# Patient Record
Sex: Male | Born: 1990 | Race: White | Hispanic: No | Marital: Single | Smoking: Never smoker
Health system: Southern US, Community
[De-identification: ages and names within clinical notes are randomized; demographics above are authoritative.]

## PROBLEM LIST (undated history)

## (undated) DIAGNOSIS — R42 Dizziness and giddiness: Secondary | ICD-10-CM

## (undated) HISTORY — DX: Dizziness and giddiness: R42

---
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
## 2012-08-30 ENCOUNTER — Encounter (HOSPITAL_COMMUNITY): Payer: Self-pay | Admitting: Emergency Medicine

## 2012-08-30 ENCOUNTER — Emergency Department (HOSPITAL_COMMUNITY): Payer: BC Managed Care – PPO

## 2012-08-30 ENCOUNTER — Emergency Department (HOSPITAL_COMMUNITY)
Admission: EM | Admit: 2012-08-30 | Discharge: 2012-08-30 | Disposition: A | Discharge status: Home / Self Care | Payer: BC Managed Care – PPO | Attending: Emergency Medicine | Admitting: Emergency Medicine

## 2012-08-30 DIAGNOSIS — M545 Low back pain, unspecified: Secondary | ICD-10-CM | POA: Insufficient documentation

## 2012-08-30 DIAGNOSIS — M549 Dorsalgia, unspecified: Secondary | ICD-10-CM

## 2012-08-30 DIAGNOSIS — R109 Unspecified abdominal pain: Secondary | ICD-10-CM | POA: Insufficient documentation

## 2012-08-30 LAB — COMPREHENSIVE METABOLIC PANEL
ALT: 15 U/L (ref 0–53)
AST: 15 U/L (ref 0–37)
Albumin: 4.3 g/dL (ref 3.5–5.2)
Alkaline Phosphatase: 41 U/L (ref 39–117)
BUN: 18 mg/dL (ref 6–23)
CO2: 29 mEq/L (ref 19–32)
Calcium: 9.7 mg/dL (ref 8.4–10.5)
Chloride: 102 mEq/L (ref 96–112)
Creatinine, Ser: 0.87 mg/dL (ref 0.50–1.35)
GFR calc Af Amer: >90 mL/min (ref >90)
GFR calc non Af Amer: >90 mL/min (ref >90)
Glucose, Bld: 99 mg/dL (ref 70–99)
Potassium: 3.9 mEq/L (ref 3.5–5.1)
Sodium: 139 mEq/L (ref 135–145)
Total Bilirubin: 1 mg/dL (ref 0.3–1.2)
Total Protein: 7 g/dL (ref 6.0–8.3)

## 2012-08-30 LAB — URINALYSIS, ROUTINE W REFLEX MICROSCOPIC
Bilirubin Urine: NEGATIVE
Glucose, UA: NEGATIVE mg/dL
Hgb urine dipstick: NEGATIVE
Ketones, ur: NEGATIVE mg/dL
Leukocytes, UA: NEGATIVE
Nitrite: NEGATIVE
Protein, ur: NEGATIVE mg/dL
Specific Gravity, Urine: 1.037 — ABNORMAL HIGH (ref 1.005–1.030)
Urobilinogen, UA: 0.2 mg/dL (ref 0.0–1.0)
pH: 5.5 (ref 5.0–8.0)

## 2012-08-30 LAB — CBC
HCT: 39.8 % (ref 39.0–52.0)
Hemoglobin: 14.5 g/dL (ref 13.0–17.0)
MCH: 31.4 pg (ref 26.0–34.0)
MCHC: 36.4 g/dL — ABNORMAL HIGH (ref 30.0–36.0)
MCV: 86.1 fL (ref 78.0–100.0)
Platelets: 206 10*3/uL (ref 150–400)
RBC: 4.62 MIL/uL (ref 4.22–5.81)
RDW: 12.2 % (ref 11.5–15.5)
WBC: 5 10*3/uL (ref 4.0–10.5)

## 2012-08-30 MED ORDER — SODIUM CHLORIDE 0.9 % IV SOLN: INTRAVENOUS | Status: DC

## 2012-08-30 MED ORDER — ONDANSETRON HCL 4 MG/2ML IJ SOLN: 4.0000 mg | Freq: Once | INTRAMUSCULAR | Status: DC

## 2012-08-30 MED ORDER — FENTANYL CITRATE 0.05 MG/ML IJ SOLN: 50.0000 ug | INTRAMUSCULAR | Status: DC | PRN

## 2012-08-30 NOTE — ED Notes (Signed)
"  After I ate around 9pm last night my lower stomach started to hurt real bad and the lower part of my back"  I felt nauseated but I didn't throw up"

## 2012-08-30 NOTE — ED Provider Notes (Signed)
Care assumed t the change of shift. Pt seen for abdominal cramping during the night. Labs unremarkable, was ready for discharge when pain returned. Sent for [place] which is negative. Pain resolved now, feeling well and ready to go home.   Charles B. Bernette Mayers, MD 08/30/12 [phone]

## 2012-08-30 NOTE — ED Provider Notes (Addendum)
History     CSN: [id number]  Arrival date & time 08/30/12  4098   First MD Initiated Contact with Patient 08/30/12 0448      Chief Complaint  Patient presents with  . Abdominal Cramping    (Consider location/radiation/quality/duration/timing/severity/associated sxs/prior treatment) HPI History provided by patient. Not feeling well around 9 PM last night after dinner. He had some lower abdominal discomfort. Symptoms progressively worsening to severe pain in lower back and lower abdomen with associated nausea.  No vomiting or diarrhea. No constipation. Last bowel movement last night report is normal. No fevers or chills. No recent illness or known sick contacts. No recent travel. No known bad food exposures. Now the patient is in emergency department symptoms have completely resolved. He had taken some ibuprofen earlier which she believes may have helped. He denies any gas or cramping. No unilateral symptoms. No hematuria or history of kidney stones.    No past medical history on file.  No past surgical history on file.  No family history on file.  History  Substance Use Topics  . Smoking status: Not on file  . Smokeless tobacco: Not on file  . Alcohol Use: No      Review of Systems  Constitutional: Negative for fever and chills.  HENT: Negative for neck pain and neck stiffness.   Eyes: Negative for pain.  Respiratory: Negative for shortness of breath.   Cardiovascular: Negative for chest pain.  Gastrointestinal: Positive for abdominal pain. Negative for blood in stool.  Genitourinary: Negative for dysuria.  Musculoskeletal: Negative for back pain.  Skin: Negative for rash.  Neurological: Negative for headaches.  All other systems reviewed and are negative.    Allergies  Review of patient's allergies indicates no known allergies.  Home Medications   Current Outpatient Rx  Name  Route  Sig  Dispense  Refill  . ibuprofen (ADVIL,MOTRIN) 200 MG tablet   Oral   Take  200 mg by mouth every 6 (six) hours as needed for pain.           BP 141/84  Pulse 70  Temp(Src) 97.7 F (36.5 C) (Oral)  Ht 5\' 9"  (1.753 m)  Wt 134 lb (60.782 kg)  BMI 19.78 kg/m2  SpO2 99%  Physical Exam  Constitutional: He is oriented to person, place, and time. He appears well-developed and well-nourished.  HENT:  Head: Normocephalic and atraumatic.  Eyes: EOM are normal. Pupils are equal, round, and reactive to light.  Neck: Neck supple.  Cardiovascular: Normal rate, regular rhythm and intact distal pulses.   Pulmonary/Chest: Effort normal and breath sounds normal. No respiratory distress.  Abdominal: Soft. Bowel sounds are normal. He exhibits no distension and no mass. There is no tenderness. There is no rebound and no guarding.  No CVA tenderness  Musculoskeletal: Normal range of motion. He exhibits no edema.  Neurological: He is alert and oriented to person, place, and time.  Skin: Skin is warm and dry.    ED Course  Procedures (including critical care time)  Results for orders placed during the hospital encounter of 08/30/12  CBC      Result Value Range   WBC 5.0  4.0 - 10.5 K/uL   RBC 4.62  4.22 - 5.81 MIL/uL   Hemoglobin 14.5  13.0 - 17.0 g/dL   HCT 11.9  14.7 - 82.9 %   MCV 86.1  78.0 - 100.0 fL   MCH 31.4  26.0 - 34.0 pg   MCHC 36.4 (*) 30.0 -  36.0 g/dL   RDW 16.1  09.6 - 04.5 %   Platelets 206  150 - 400 K/uL  COMPREHENSIVE METABOLIC PANEL      Result Value Range   Sodium 139  135 - 145 mEq/L   Potassium 3.9  3.5 - 5.1 mEq/L   Chloride 102  96 - 112 mEq/L   CO2 29  19 - 32 mEq/L   Glucose, Bld 99  70 - 99 mg/dL   BUN 18  6 - 23 mg/dL   Creatinine, Ser 4.09  0.50 - 1.35 mg/dL   Calcium 9.7  8.4 - 81.1 mg/dL   Total Protein 7.0  6.0 - 8.3 g/dL   Albumin 4.3  3.5 - 5.2 g/dL   AST 15  0 - 37 U/L   ALT 15  0 - 53 U/L   Alkaline Phosphatase 41  39 - 117 U/L   Total Bilirubin 1.0  0.3 - 1.2 mg/dL   GFR calc non Af Amer >90  >90 mL/min   GFR calc Af  Amer >90  >90 mL/min  URINALYSIS, ROUTINE W REFLEX MICROSCOPIC      Result Value Range   Color, Urine YELLOW  YELLOW   APPearance CLOUDY (*) CLEAR   Specific Gravity, Urine 1.037 (*) 1.005 - 1.030   pH 5.5  5.0 - 8.0   Glucose, UA NEGATIVE  NEGATIVE mg/dL   Hgb urine dipstick NEGATIVE  NEGATIVE   Bilirubin Urine NEGATIVE  NEGATIVE   Ketones, ur NEGATIVE  NEGATIVE mg/dL   Protein, ur NEGATIVE  NEGATIVE mg/dL   Urobilinogen, UA 0.2  0.0 - 1.0 mg/dL   Nitrite NEGATIVE  NEGATIVE   Leukocytes, UA NEGATIVE  NEGATIVE   5:40 AM on recheck remains pain free in the ER. I had a long discussion with patient and his mother bedside - no indication for emergent imaging at this time. On further history he did have some belching after onset of pain but denies any heartburn symptoms otherwise. Plan discharge home, outpatient referral provided, abdominal pain precautions verbalized as understood.   MDM   Abdominal pain resolved prior to arrival - severe lower back and bilateral lower abdominal pain at home. Asymptomatic in the ED. No lateralizing symptoms. No acute abdomen.   Evaluated with urinalysis labs reviewed as above - no leukocytosis or hematuria or abnormalities.  Serial abdominal exams benign  Vital signs and nursing notes reviewed        Sunnie Nielsen, MD 08/30/12 0543     This patient was being discharged she developed return of pain located epigastric and right upper quadrant with tenderness. Abdominal ultrasound ordered.  Sunnie Nielsen, MD 08/30/12 [phone]

## 2012-08-31 LAB — URINE CULTURE
Colony Count: NO GROWTH
Culture: NO GROWTH

## 2021-05-10 ENCOUNTER — Emergency Department (HOSPITAL_COMMUNITY): Payer: BC Managed Care – PPO

## 2021-05-10 ENCOUNTER — Emergency Department (HOSPITAL_COMMUNITY)
Admission: EM | Admit: 2021-05-10 | Discharge: 2021-05-10 | Disposition: A | Discharge status: Home / Self Care | Payer: BC Managed Care – PPO | Attending: Emergency Medicine | Admitting: Emergency Medicine

## 2021-05-10 ENCOUNTER — Other Ambulatory Visit: Payer: Self-pay

## 2021-05-10 ENCOUNTER — Encounter (HOSPITAL_COMMUNITY): Payer: Self-pay | Admitting: Emergency Medicine

## 2021-05-10 DIAGNOSIS — S0102XA Laceration with foreign body of scalp, initial encounter: Secondary | ICD-10-CM | POA: Insufficient documentation

## 2021-05-10 DIAGNOSIS — D696 Thrombocytopenia, unspecified: Secondary | ICD-10-CM | POA: Diagnosis not present

## 2021-05-10 DIAGNOSIS — Z79899 Other long term (current) drug therapy: Secondary | ICD-10-CM | POA: Insufficient documentation

## 2021-05-10 DIAGNOSIS — Z23 Encounter for immunization: Secondary | ICD-10-CM | POA: Insufficient documentation

## 2021-05-10 DIAGNOSIS — S0181XA Laceration without foreign body of other part of head, initial encounter: Secondary | ICD-10-CM | POA: Diagnosis not present

## 2021-05-10 DIAGNOSIS — S0990XA Unspecified injury of head, initial encounter: Secondary | ICD-10-CM | POA: Diagnosis present

## 2021-05-10 LAB — CBC
HCT: 40 % (ref 39.0–52.0)
Hemoglobin: 13.9 g/dL (ref 13.0–17.0)
MCH: 31.3 pg (ref 26.0–34.0)
MCHC: 34.8 g/dL (ref 30.0–36.0)
MCV: 90.1 fL (ref 80.0–100.0)
Platelets: 86 10*3/uL — ABNORMAL LOW (ref 150–400)
RBC: 4.44 MIL/uL (ref 4.22–5.81)
RDW: 11.9 % (ref 11.5–15.5)
WBC: 6.7 10*3/uL (ref 4.0–10.5)
nRBC: 0 % (ref 0.0–0.2)

## 2021-05-10 LAB — URINALYSIS, MICROSCOPIC (REFLEX)
Bacteria, UA: NONE SEEN
WBC, UA: NONE SEEN WBC/hpf (ref 0–5)

## 2021-05-10 LAB — URINALYSIS, ROUTINE W REFLEX MICROSCOPIC
Bilirubin Urine: NEGATIVE
Glucose, UA: NEGATIVE mg/dL
Ketones, ur: NEGATIVE mg/dL
Leukocytes,Ua: NEGATIVE
Nitrite: NEGATIVE
Protein, ur: NEGATIVE mg/dL
Specific Gravity, Urine: >1.03 — ABNORMAL HIGH (ref 1.005–1.030)
pH: 5.5 (ref 5.0–8.0)

## 2021-05-10 LAB — ETHANOL: Alcohol, Ethyl (B): <10 mg/dL (ref <10)

## 2021-05-10 LAB — COMPREHENSIVE METABOLIC PANEL
ALT: 31 U/L (ref 0–44)
AST: 30 U/L (ref 15–41)
Albumin: 3.7 g/dL (ref 3.5–5.0)
Alkaline Phosphatase: 28 U/L — ABNORMAL LOW (ref 38–126)
Anion gap: 7 (ref 5–15)
BUN: 21 mg/dL — ABNORMAL HIGH (ref 6–20)
CO2: 23 mmol/L (ref 22–32)
Calcium: 8.2 mg/dL — ABNORMAL LOW (ref 8.9–10.3)
Chloride: 108 mmol/L (ref 98–111)
Creatinine, Ser: 0.76 mg/dL (ref 0.61–1.24)
GFR, Estimated: >60 mL/min (ref >60)
Glucose, Bld: 104 mg/dL — ABNORMAL HIGH (ref 70–99)
Potassium: 3.1 mmol/L — ABNORMAL LOW (ref 3.5–5.1)
Sodium: 138 mmol/L (ref 135–145)
Total Bilirubin: 1 mg/dL (ref 0.3–1.2)
Total Protein: 5.9 g/dL — ABNORMAL LOW (ref 6.5–8.1)

## 2021-05-10 LAB — LACTIC ACID, PLASMA: Lactic Acid, Venous: 1.6 mmol/L (ref 0.5–1.9)

## 2021-05-10 MED ORDER — TETANUS-DIPHTH-ACELL PERTUSSIS 5-2.5-18.5 LF-MCG/0.5 IM SUSY
0.5000 mL | PREFILLED_SYRINGE | Freq: Once | INTRAMUSCULAR | Status: AC
  Administered 2021-05-10: 0.5 mL via INTRAMUSCULAR
  Filled 2021-05-10: qty 0.5

## 2021-05-10 MED ORDER — LIDOCAINE-EPINEPHRINE (PF) 2 %-1:200000 IJ SOLN
20.0000 mL | Freq: Once | INTRAMUSCULAR | Status: AC
  Administered 2021-05-10: 20 mL via INTRADERMAL
  Filled 2021-05-10: qty 20
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
## 2021-05-10 NOTE — ED Provider Notes (Signed)
..Laceration Repair  Date/Time: 05/10/2021 9:35 AM Performed by: Marita Kansas, PA-C Authorized by: Marita Kansas, PA-C   Consent:    Consent obtained:  Verbal   Consent given by:  Patient   Risks discussed:  Infection, need for additional repair, pain, poor cosmetic result and poor wound healing   Alternatives discussed:  No treatment and delayed treatment Universal protocol:    Procedure explained and questions answered to patient or proxy's satisfaction: yes     Relevant documents present and verified: yes     Imaging studies available: yes     Patient identity confirmed:  Verbally with patient Anesthesia:    Anesthesia method:  Local infiltration   Local anesthetic:  Lidocaine 1% WITH epi Laceration details:    Location:  Scalp   Scalp location:  Frontal   Length (cm):  7 Pre-procedure details:    Preparation:  Patient was prepped and draped in usual sterile fashion and imaging obtained to evaluate for foreign bodies Exploration:    Hemostasis achieved with:  Direct pressure   Imaging outcome: foreign body not noted     Wound extent: no foreign bodies/material noted, no muscle damage noted, no tendon damage noted, no underlying fracture noted and no vascular damage noted   Treatment:    Area cleansed with:  Saline and povidone-iodine   Amount of cleaning:  Extensive   Irrigation solution:  Sterile saline   Irrigation volume:  500cc   Irrigation method:  Pressure wash   Undermining:  None Skin repair:    Repair method:  Sutures   Suture size:  4-0   Suture material:  Prolene   Suture technique:  Simple interrupted   Number of sutures:  9 Approximation:    Approximation:  Close Repair type:    Repair type:  Complex Post-procedure details:    Dressing:  Antibiotic ointment and non-adherent dressing   Procedure completion:  Tolerated well, no immediate complications .Marland KitchenLaceration Repair  Date/Time: 05/10/2021 9:37 AM Performed by: Marita Kansas, PA-C Authorized by: Marita Kansas,  PA-C   Consent:    Consent obtained:  Verbal   Consent given by:  Patient   Risks discussed:  Infection, need for additional repair, pain, poor cosmetic result and poor wound healing   Alternatives discussed:  No treatment and delayed treatment Universal protocol:    Procedure explained and questions answered to patient or proxy's satisfaction: yes     Relevant documents present and verified: yes     Imaging studies available: yes     Patient identity confirmed:  Verbally with patient Anesthesia:    Anesthesia method:  Local infiltration   Local anesthetic:  Lidocaine 1% WITH epi Laceration details:    Location:  Face   Face location:  Forehead   Length (cm):  5 Exploration:    Hemostasis achieved with:  Direct pressure   Wound extent: no muscle damage noted, no nerve damage noted, no tendon damage noted, no underlying fracture noted and no vascular damage noted   Treatment:    Area cleansed with:  Povidone-iodine and saline   Amount of cleaning:  Extensive   Irrigation solution:  Sterile saline   Irrigation volume:  500cc   Irrigation method:  Pressure wash   Undermining:  None Skin repair:    Repair method:  Sutures   Suture size:  5-0   Suture technique:  Running locked   Number of sutures:  8 Approximation:    Approximation:  Close Repair type:    Repair  type:  Simple .Marland KitchenLaceration Repair  Date/Time: 05/10/2021 9:38 AM Performed by: Marita Kansas, PA-C Authorized by: Marita Kansas, PA-C   Consent:    Consent obtained:  Verbal   Consent given by:  Patient   Risks discussed:  Infection, need for additional repair, pain, poor cosmetic result and poor wound healing   Alternatives discussed:  No treatment and delayed treatment Universal protocol:    Procedure explained and questions answered to patient or proxy's satisfaction: yes     Relevant documents present and verified: yes     Imaging studies available: yes     Patient identity confirmed:  Verbally with  patient Anesthesia:    Anesthesia method:  Local infiltration Laceration details:    Location:  Scalp   Scalp location:  Frontal   Length (cm):  4 Pre-procedure details:    Preparation:  Patient was prepped and draped in usual sterile fashion Exploration:    Hemostasis achieved with:  Direct pressure   Wound extent: no foreign bodies/material noted, no muscle damage noted, no nerve damage noted, no tendon damage noted, no underlying fracture noted and no vascular damage noted   Treatment:    Area cleansed with:  Povidone-iodine and saline   Amount of cleaning:  Extensive   Irrigation solution:  Sterile saline   Irrigation volume:  500cc   Irrigation method:  Pressure wash   Undermining:  None Skin repair:    Repair method:  Sutures   Suture size:  4-0   Suture material:  Prolene   Number of sutures:  4 Approximation:    Approximation:  Close Repair type:    Repair type:  Simple    Marita Kansas, PA-C 05/10/21 0943    Terrilee Files, MD 05/10/21 1753

## 2021-05-10 NOTE — ED Notes (Signed)
Pt transported to CT ?

## 2021-05-10 NOTE — Discharge Instructions (Addendum)
Do not let your laceration (cut) get wet for the next 48 hours. After that you may allow soapy water to drain down the wound to clean it. Please do not scrub.  Your stitches will need to be removed in 5 to 7 days. To minimize scarring, you can apply a vaseline based ointment for the next 2 weeks and keep it out of direct sun light. After that, you may apply sunscreen for the next several months. The sutures near your hairline will need to be removed in 7-10 days. The sutures in the forehead will dissolve on their own. Return if your wound appears to be infected (see laceration care instructions).  For pain control you may take at 1000 mg of Tylenol every 8 hours as needed.
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
## 2021-05-12 ENCOUNTER — Ambulatory Visit: Payer: BC Managed Care – PPO | Admitting: Internal Medicine

## 2021-05-12 ENCOUNTER — Ambulatory Visit (INDEPENDENT_AMBULATORY_CARE_PROVIDER_SITE_OTHER): Payer: BC Managed Care – PPO

## 2021-05-12 ENCOUNTER — Encounter: Payer: Self-pay | Admitting: Internal Medicine

## 2021-05-12 ENCOUNTER — Other Ambulatory Visit: Payer: Self-pay

## 2021-05-12 VITALS — BP 120/68 | HR 91 | Ht 69.0 in | Wt 138.0 lb

## 2021-05-12 DIAGNOSIS — R55 Syncope and collapse: Secondary | ICD-10-CM

## 2021-05-12 DIAGNOSIS — R002 Palpitations: Secondary | ICD-10-CM

## 2021-05-12 NOTE — Patient Instructions (Addendum)
Medication Instructions:  Your physician recommends that you continue on your current medications as directed. Please refer to the Current Medication list given to you today.  *If you need a refill on your cardiac medications before your next appointment, please call your pharmacy*   Lab Work: NONE If you have labs (blood work) drawn today and your tests are completely normal, you will receive your results only by: MyChart Message (if you have MyChart) OR A paper copy in the mail If you have any lab test that is abnormal or we need to change your treatment, we will call you to review the results.   Testing/Procedures: Your physician has requested that you wear a heart monitor.  Your physician has requested that you have an echocardiogram. Echocardiography is a painless test that uses sound waves to create images of your heart. It provides your doctor with information about the size and shape of your heart and how well your hearts chambers and valves are working. This procedure takes approximately one hour. There are no restrictions for this procedure.   Follow-Up: At [facility], you and your health needs are our priority.  As part of our continuing mission to provide you with exceptional heart care, we have created designated Provider Care Teams.  These Care Teams include your primary Cardiologist (physician) and Advanced Practice Providers (APPs -  Physician Assistants and Nurse Practitioners) who all work together to provide you with the care you need, when you need it.  We recommend signing up for the patient portal called "MyChart".  Sign up information is provided on this After Visit Summary.  MyChart is used to connect with patients for Virtual Visits (Telemedicine).  Patients are able to view lab/test results, encounter notes, upcoming appointments, etc.  Non-urgent messages can be sent to your provider as well.   To learn more about what you can do with MyChart, go to  [email or web address].    Your next appointment:   3 - 45month(s)  The format for your next appointment:   In Person  Provider:   Riley Lam, MD    Other Instructions ZIO XT- Long Term Monitor Instructions  Your physician has requested you wear a ZIO patch monitor for 14 days.  This is a single patch monitor. [employer] supplies one patch monitor per enrollment. Additional stickers are not available. Please do not apply patch if you will be having a Nuclear Stress Test,  Echocardiogram, Cardiac CT, MRI, or Chest Xray during the period you would be wearing the  monitor. The patch cannot be worn during these tests. You cannot remove and re-apply the  ZIO XT patch monitor.  Your ZIO patch monitor will be mailed 3 day USPS to your address on file. It may take 3-5 days  to receive your monitor after you have been enrolled.  Once you have received your monitor, please review the enclosed instructions. Your monitor  has already been registered assigning a specific monitor serial # to you.  Billing and Patient Assistance Program Information  We have supplied [employer] with any of your insurance information on file for billing purposes. [employer] offers a sliding scale Patient Assistance Program for patients that do not have  insurance, or whose insurance does not completely cover the cost of the ZIO monitor.  You must apply for the Patient Assistance Program to qualify for this discounted rate.  To apply, please call [employer] at [phone], select option 4, select option 2, ask to apply for  Patient Assistance Program. Meredeth Ide  will ask your household income, and how many people  are in your household. They will quote your out-of-pocket cost based on that information.  [employer] will also be able to set up a 63-month, interest-free payment plan if needed.  Applying the monitor   Shave hair from upper left chest.  Hold abrader disc by orange tab. Rub abrader in 40 strokes over the  upper left chest as  indicated in your monitor instructions.  Clean area with 4 enclosed alcohol pads. Let dry.  Apply patch as indicated in monitor instructions. Patch will be placed under collarbone on left  side of chest with arrow pointing upward.  Rub patch adhesive wings for 2 minutes. Remove white label marked "1". Remove the white  label marked "2". Rub patch adhesive wings for 2 additional minutes.  While looking in a mirror, press and release button in center of patch. A small green light will  flash 3-4 times. This will be your only indicator that the monitor has been turned on.  Do not shower for the first 24 hours. You may shower after the first 24 hours.  Press the button if you feel a symptom. You will hear a small click. Record Date, Time and  Symptom in the Patient Logbook.  When you are ready to remove the patch, follow instructions on the last 2 pages of Patient  Logbook. Stick patch monitor onto the last page of Patient Logbook.  Place Patient Logbook in the blue and white box. Use locking tab on box and tape box closed  securely. The blue and white box has prepaid postage on it. Please place it in the mailbox as  soon as possible. Your physician should have your test results approximately 7 days after the  monitor has been mailed back to [facility].  Call [facility] Customer Care at [phone] if you have questions regarding  your ZIO XT patch monitor. Call them immediately if you see an orange light blinking on your  monitor.  If your monitor falls off in less than 4 days, contact our Monitor department at [phone].  If your monitor becomes loose or falls off after 4 days call [employer] at [phone] for  suggestions on securing your monitor

## 2021-05-12 NOTE — Progress Notes (Unsigned)
Applied a 14 day Zio XT to patient in the office 
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
## 2021-05-19 ENCOUNTER — Other Ambulatory Visit (HOSPITAL_COMMUNITY): Payer: BC Managed Care – PPO

## 2021-05-25 ENCOUNTER — Telehealth: Payer: Self-pay | Admitting: Internal Medicine

## 2021-05-25 NOTE — Telephone Encounter (Signed)
Called Patient in order to give him good news about his heart monitor (no evidence of arrhythmias)  Patient has a VM that is full and is not accepting new messages.  Triage is ok to give patient the good news (normal heart monitor).  Rudean Haskell, MD [address] [place], [place] [address] [phone]  2:55 PM

## 2021-05-26 NOTE — Telephone Encounter (Signed)
Unable to leave a voice mail, mailbox is full. Will attempt to call at a later time.

## 2021-05-29 ENCOUNTER — Ambulatory Visit (HOSPITAL_COMMUNITY): Payer: BC Managed Care – PPO

## 2021-06-01 NOTE — Telephone Encounter (Signed)
Mailbox full unable to leave a voice mail.  This is the 3rd attempt to reach pt will mail letter requesting call back for results.

## 2021-08-14 ENCOUNTER — Ambulatory Visit: Payer: BC Managed Care – PPO | Admitting: Internal Medicine
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
